# Patient Record
Sex: Male | Born: 1965 | Race: White | Hispanic: No | Marital: Married | Smoking: Never smoker
Health system: Southern US, Community
[De-identification: ages and names within clinical notes are randomized; demographics above are authoritative.]

## PROBLEM LIST (undated history)

## (undated) DIAGNOSIS — N289 Disorder of kidney and ureter, unspecified: Secondary | ICD-10-CM

## (undated) HISTORY — PX: CHOLECYSTECTOMY: SHX55

---

## 2012-10-11 ENCOUNTER — Ambulatory Visit: Payer: Self-pay

## 2012-10-23 ENCOUNTER — Ambulatory Visit: Payer: Self-pay | Admitting: Surgery

## 2014-06-28 IMAGING — XA DG CHOLANGIOGRAM OPERATIVE
5 series · 11 of 11 positions shown · non-contrast
Comparison: none

REASON FOR EXAM: Cholelithiasis
COMMENTS:

[Series 1: cont. · 1 of 1 slices shown (1 of 4)]
[im 1/1]
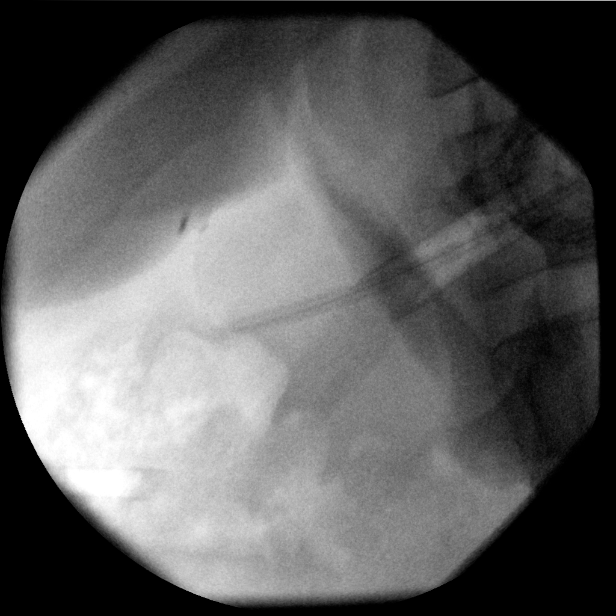

[Series 2: cont. · 3 of 3 frames shown (2 of 4)]
[frame 1/3]
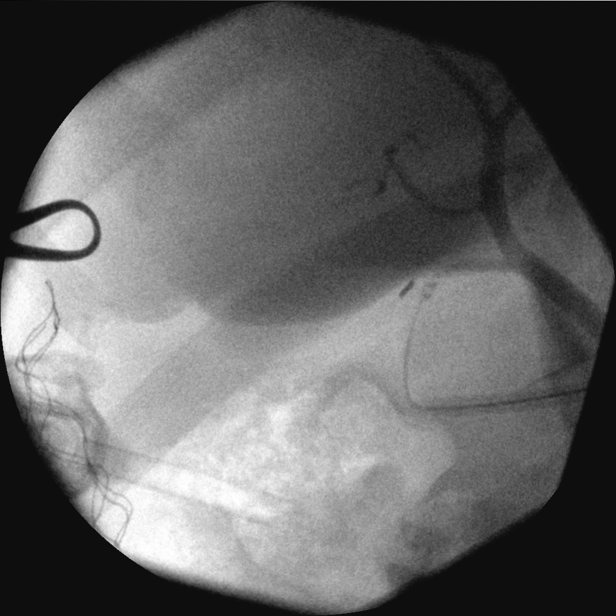
[frame 2/3]
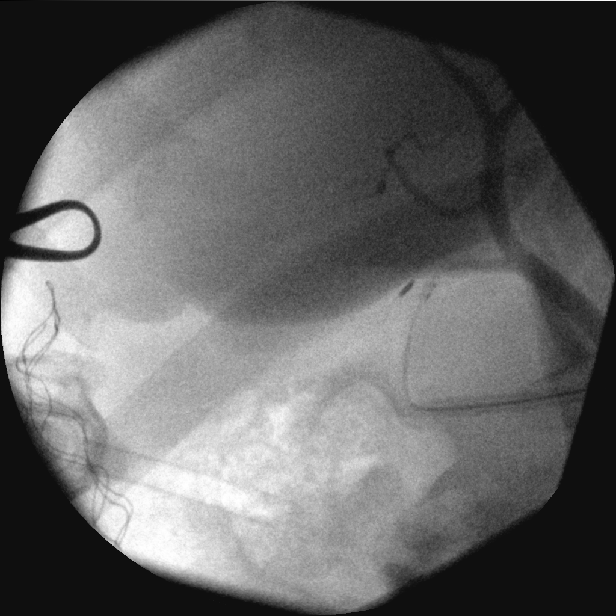
[frame 3/3]
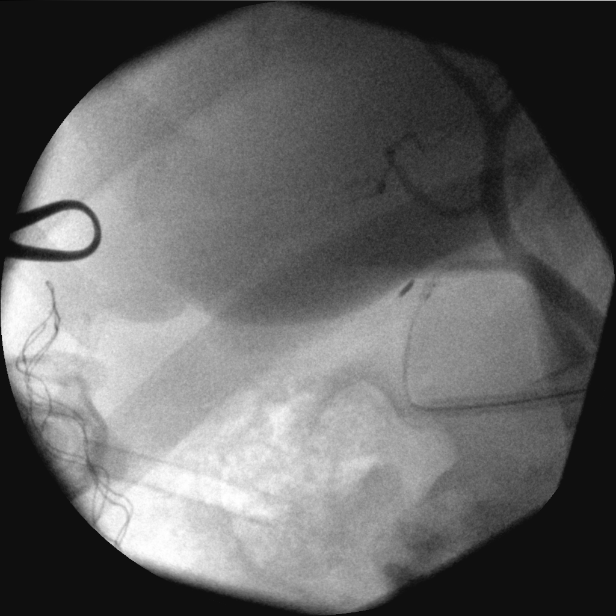

[Series 3: cont. · 2 of 2 frames shown (3 of 4)]
[frame 1/2]
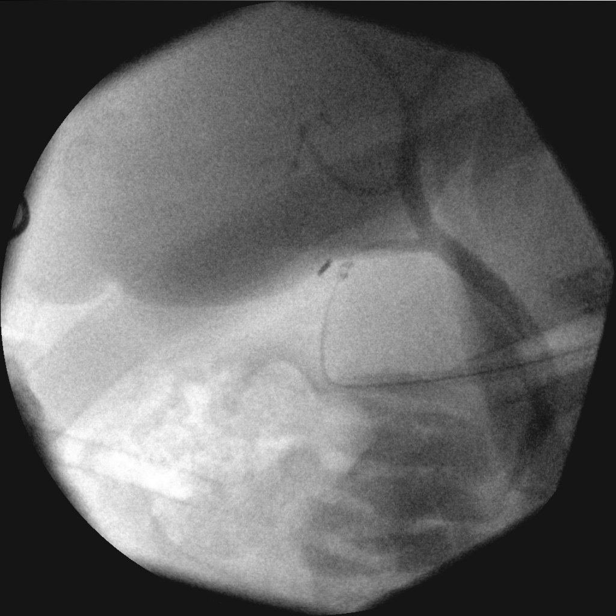
[frame 2/2]
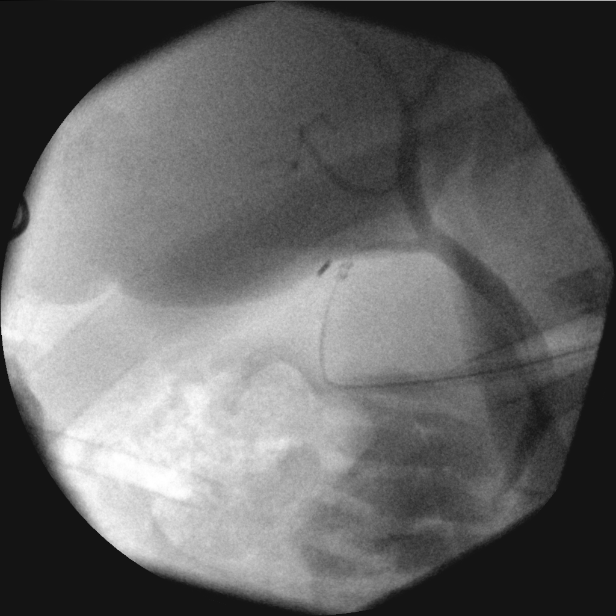

[Series 4: cont. · 4 of 9 frames shown (4 of 4)]
[frame 2/9]
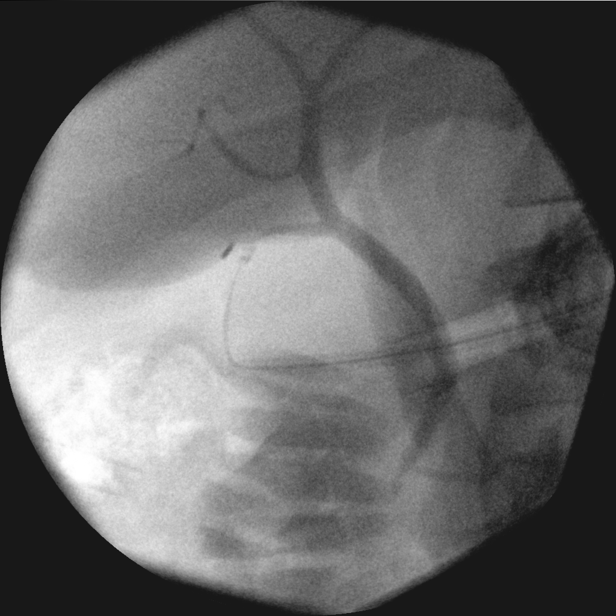
[frame 3/9]
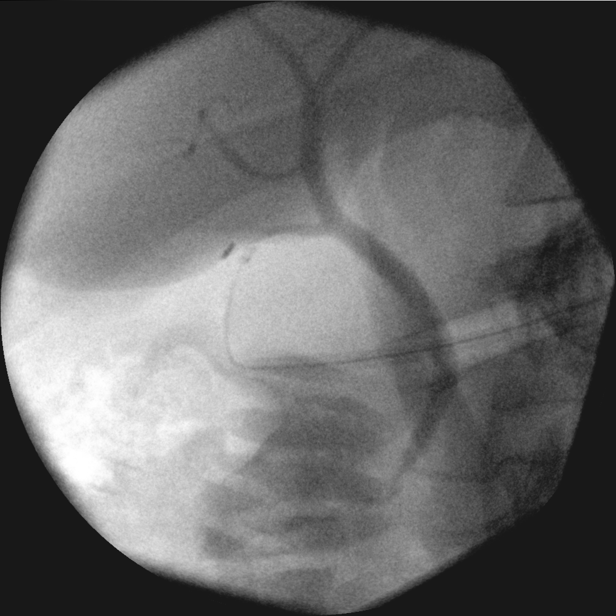
[frame 5/9]
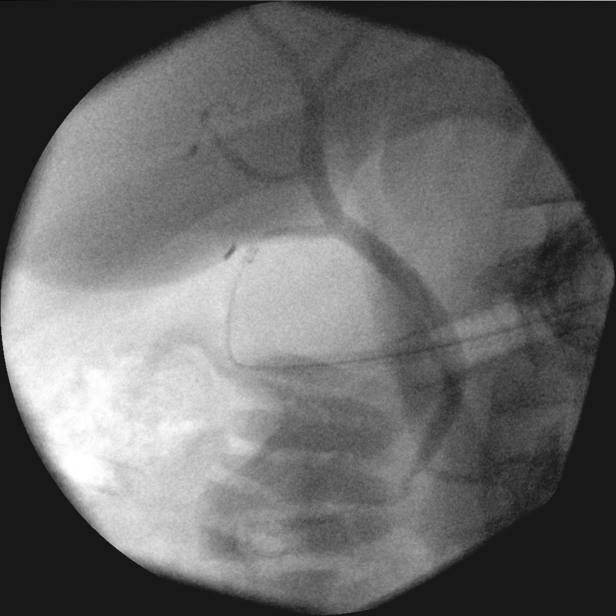
[frame 8/9]
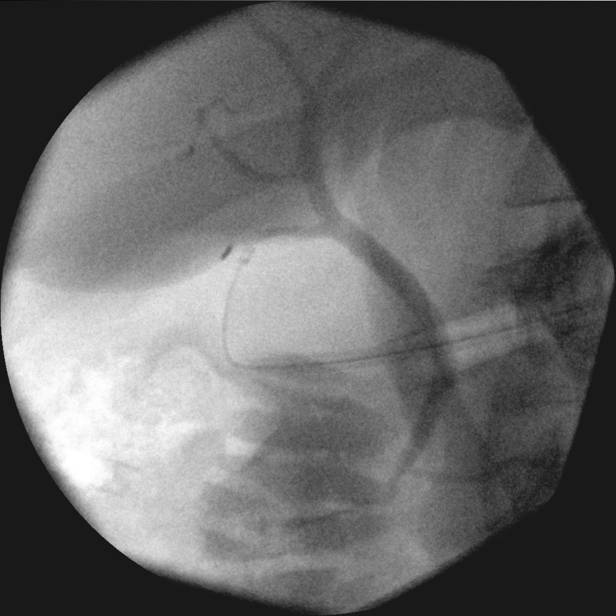

[Series 6001: (id) · 1 of 1 slices shown]
[im 1/1]
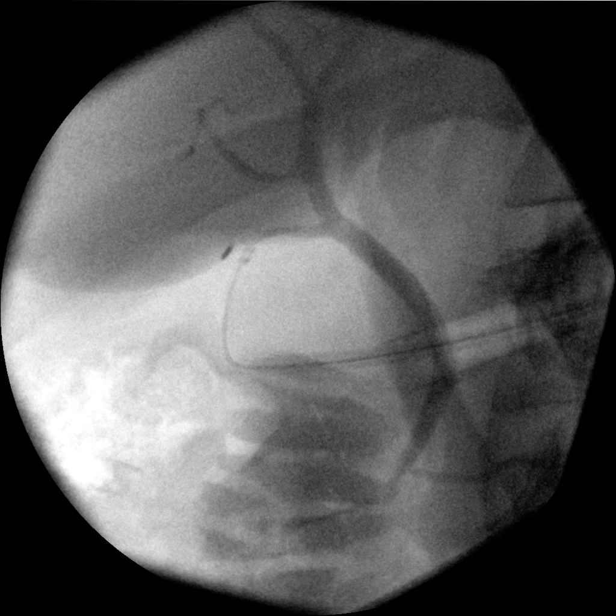

[11 of 11 positions shown; findings below may reference images not displayed]

PROCEDURE:     DXR - DXR CHOLANGIOGRAM OP (INITIAL)  - October 23, 2012  [DATE]

RESULT:     Intraoperative digital C-arm images from cholangiographic
procedure demonstrate opacification of the cystic duct, common bile duct,
common hepatic duct and left and right hepatic ducts without filling defect
or evidence of obstruction. Contrast flows into the small bowel.
IMPRESSION: No evidence of obstruction or retained biliary duct stones.

[REDACTED]

## 2014-07-05 NOTE — Op Note (Signed)
PATIENT NAME:  Cody Jackson, Cody Jackson MR#:  [id number] DATE OF BIRTH:  05/13/1965  DATE OF PROCEDURE:  10/23/2012  PREOPERATIVE DIAGNOSIS: Acute cholecystitis, cholelithiasis.   POSTOPERATIVE DIAGNOSIS: Acute cholecystitis, cholelithiasis.   PROCEDURE: Laparoscopic cholecystectomy, cholangiogram.   SURGEON: Renda RollsWilton Smith, MD  ANESTHESIA: General.   INDICATION: This 49 year old male had recent epigastric pain and ultrasound findings of gallstones with thickened gallbladder wall, and surgery was recommended for definitive treatment.   DESCRIPTION OF PROCEDURE: The patient was placed on the operating table in the supine position under general anesthesia. The abdomen was clipped and prepared with ChloraPrep and draped in a sterile manner.   A short incision was made in the inferior aspect of the umbilicus and carried down to the deep fascia which was grasped with laryngeal hook and elevated. A Veress needle was inserted, aspirated and irrigated with a saline solution. Next, the peritoneal cavity was inflated with carbon dioxide. The Veress needle was removed. The 10 mm cannula was inserted. The 10 mm, 0 degree laparoscope was inserted to view the peritoneal cavity. The liver appeared normal. Brief survey in all directions revealed no gross abnormality. Another incision was made in the epigastrium slightly to the right of the midline to introduce an 11 mm cannula. Two incisions were made in the lateral aspect of the right upper quadrant to introduce two 5 mm cannulas.   The gallbladder was found to be inflamed with thickened wall. It was retracted towards the right shoulder. The infundibulum was retracted inferiorly and laterally. The porta hepatis was noted. The neck of the gallbladder was mobilized with incision of the visceral peritoneum. The cystic duct was dissected free from surrounding structures. The cystic artery was identified and dissected free from surrounding structures. There was some moderate  degree of oozing and several small bleeding points were cauterized, and with further dissection a critical view of safety was demonstrated. An Endo Clip was placed across the cystic duct adjacent to the neck of the gallbladder. An incision was made in the cystic duct to introduce a Reddick catheter. Half-strength Conray-60 dye was injected as the cholangiogram was done with fluoroscopy viewing the biliary tree and prompt flow of dye into the duodenum. The pancreatic duct was also identified. There were no stones seen in the bile duct, and the Reddick catheter was removed. The cystic duct was doubly ligated with endoclips and divided. The cystic artery was controlled with double endoclips and divided. The gallbladder was dissected free from the liver with hook and cautery. There were several small bleeding points which were cauterized, and the gallbladder was completely separated. There was 1 bleeding point at the fundal end of the gallbladder, in the gallbladder bed, which was cauterized several times and subsequently resolved. The site was irrigated with heparinized saline solution and aspirated. Hemostasis was intact. It is noted that during the course of dissection there was 1 small pocket of pus which was encountered which may have amounted to less than a cubic centimeter, but did not run down, was quickly suctioned. The gallbladder was placed into an Endo Catch bag and pulled up through the infraumbilical incision. The gallbladder was incised and was aspirated. There was thickening of the gallbladder wall and large stones. The incision was lengthened by about 8 mm, and the fascial incision was lengthened by about 8 mm, and with further dissection and manipulation and suctioning, the gallbladder was removed, still within the bag, and submitted in formalin for routine pathology. The right upper quadrant was further inspected, irrigated  and aspirated. Hemostasis was intact. The cannulas were removed. Carbon  dioxide was allowed to escape from the peritoneal cavity. There was some bleeding from the upper most 5 mm incision, which was suture ligated with 5-0 chromic Next, the fascia was repaired at the umbilicus with a 0 Maxon figure-of-eight suture obliterating the opening in the fascia. Next, all incisions were closed with interrupted 5-0 chromic subcuticular sutures, benzoin and Steri-Strips. Dressings were applied with paper tape. The patient tolerated surgery satisfactorily and was then prepared for transfer to the recovery room. ____________________________ Shela Commons. Renda Rolls, MD [email or web address] D: 10/23/2012 09:27:45 ET T: 10/23/2012 10:47:36 ET JOB#: [id number]  cc: Adella Hare, MD, <Dictator> Adella Hare MD ELECTRONICALLY SIGNED 10/24/2012 15:58

## 2016-06-11 ENCOUNTER — Ambulatory Visit
Admission: EM | Admit: 2016-06-11 | Discharge: 2016-06-11 | Disposition: A | Discharge status: Home / Self Care | Payer: 59 | Attending: Family Medicine | Admitting: Family Medicine

## 2016-06-11 ENCOUNTER — Encounter: Payer: Self-pay | Admitting: *Deleted

## 2016-06-11 DIAGNOSIS — R109 Unspecified abdominal pain: Secondary | ICD-10-CM | POA: Diagnosis not present

## 2016-06-11 HISTORY — DX: Disorder of kidney and ureter, unspecified: N28.9

## 2016-06-11 LAB — URINALYSIS, COMPLETE (UACMP) WITH MICROSCOPIC
GLUCOSE, UA: NEGATIVE mg/dL
Leukocytes, UA: NEGATIVE
NITRITE: NEGATIVE
Protein, ur: 30 mg/dL — AB
SQUAMOUS EPITHELIAL / LPF: NONE SEEN
pH: 5.5 (ref 5.0–8.0)

## 2016-06-11 LAB — BASIC METABOLIC PANEL
Anion gap: 7 (ref 5–15)
BUN: 13 mg/dL (ref 6–20)
CALCIUM: 9.3 mg/dL (ref 8.9–10.3)
CHLORIDE: 104 mmol/L (ref 101–111)
CO2: 25 mmol/L (ref 22–32)
Creatinine, Ser: 1.28 mg/dL — ABNORMAL HIGH (ref 0.61–1.24)
GFR calc non Af Amer: >60 mL/min (ref >60)
GLUCOSE: 110 mg/dL — AB (ref 65–99)
Potassium: 3.8 mmol/L (ref 3.5–5.1)
Sodium: 136 mmol/L (ref 135–145)

## 2016-06-11 LAB — CBC WITH DIFFERENTIAL/PLATELET
BASOS PCT: 1 %
Basophils Absolute: 0.1 10*3/uL (ref 0–0.1)
Eosinophils Absolute: 0.2 10*3/uL (ref 0–0.7)
Eosinophils Relative: 2 %
HCT: 43.6 % (ref 40.0–52.0)
Hemoglobin: 14.9 g/dL (ref 13.0–18.0)
LYMPHS ABS: 1.8 10*3/uL (ref 1.0–3.6)
Lymphocytes Relative: 20 %
MCH: 31.4 pg (ref 26.0–34.0)
MCHC: 34.3 g/dL (ref 32.0–36.0)
MCV: 91.7 fL (ref 80.0–100.0)
MONO ABS: 0.5 10*3/uL (ref 0.2–1.0)
Monocytes Relative: 6 %
Neutro Abs: 6.6 10*3/uL — ABNORMAL HIGH (ref 1.4–6.5)
Neutrophils Relative %: 71 %
Platelets: 297 10*3/uL (ref 150–440)
RBC: 4.76 MIL/uL (ref 4.40–5.90)
RDW: 13.4 % (ref 11.5–14.5)
WBC: 9.2 10*3/uL (ref 3.8–10.6)

## 2016-06-11 MED ORDER — TAMSULOSIN HCL 0.4 MG PO CAPS: 0.4000 mg | ORAL_CAPSULE | Freq: Every day | ORAL | 0 refills | Status: AC

## 2016-06-11 MED ORDER — OXYCODONE-ACETAMINOPHEN 5-325 MG PO TABS: 1.0000 | ORAL_TABLET | Freq: Three times a day (TID) | ORAL | 0 refills | Status: AC | PRN

## 2016-06-11 NOTE — Discharge Instructions (Signed)
Take medication as prescribed. Rest. Drink plenty of fluids.   Follow up with urology or primary care physician as discussed. Return to Urgent care or Emergency room for fever, increased pain, new or worsening concerns.

## 2016-06-11 NOTE — ED Triage Notes (Signed)
Patient started having lower left back/flank pain today. Patient is also experiencing urinary urgency. History of kidney stones.

## 2016-06-11 NOTE — ED Provider Notes (Addendum)
[facility] ____________________________________________  Time seen: Approximately 12:00 PM  I have reviewed the triage vital signs and the nursing notes.   HISTORY  Chief Complaint Flank Pain and Urinary Frequency   HPI Cody Jackson is a 51 y.o. male presents with a complaint of left flank pain. Patient reports left flank pain since approximately 9 AM this morning. Denies trigger or injury. Patient reports pain is constant at a 4 or 5 out of 10 with occasional increases of pain. Patient does also reports that he feels like he needs to urinate more frequently, denies flow changes or burning with urination. Patient reports that he did have mild left flank pain last week that resolved after drinking a lot of water. Patient reports he has a history of similar presentation with a left-sided kidney stone. Patient reports last kidney stones approximate 6 years ago and he was able to pass, denies any previous surgery for kidney stones. Denies previous obstructing stones. Patient states left flank pain is occasionally worse with movement but not consistently. Denies any definitive aggravating or alleviating factors.  Patient denies any penile or testicular pain, swelling, rash, penile discharge, perineal or rectal discomfort. Patient denies any recent sickness. Has continued to eat and drink well. Denies fevers.Denies chest pain, shortness of breath, abdominal pain, dysuria, extremity pain, extremity swelling or rash. Denies recent sickness. Denies recent antibiotic use.     Past Medical History:  Diagnosis Date  . Renal disorder     There are no active problems to display for this patient.   Past Surgical History:  Procedure Laterality Date  . CHOLECYSTECTOMY       No current facility-administered medications for this encounter.   Current Outpatient Prescriptions:  .  oxyCODONE-acetaminophen (ROXICET) 5-325 MG tablet, Take 1 tablet by mouth every 8 (eight) hours as needed  for moderate pain or severe pain (Do not drive or operate heavy machinery while taking as can cause drowsiness.)., Disp: 9 tablet, Rfl: 0 .  tamsulosin (FLOMAX) 0.4 MG CAPS capsule, Take 1 capsule (0.4 mg total) by mouth daily., Disp: 10 capsule, Rfl: 0  Allergies Patient has no known allergies.  family history Father: kidney stones  Social History Social History  Substance Use Topics  . Smoking status: Never Smoker  . Smokeless tobacco: Never Used  . Alcohol use Yes    Review of Systems Constitutional: No fever/chills Eyes: No visual changes. ENT: No sore throat. Cardiovascular: Denies chest pain. Respiratory: Denies shortness of breath. Gastrointestinal: No abdominal pain.  No nausea, no vomiting. No diarrhea. No constipation.  Genitourinary: As above.  Musculoskeletal: As above. Skin: Negative for rash.  ____________________________________________   PHYSICAL EXAM:  VITAL SIGNS: ED Triage Vitals  Enc Vitals Group     BP 06/11/16 1052 (!) 165/107     Pulse Rate 06/11/16 1052 75     Resp 06/11/16 1052 16     Temp 06/11/16 1052 97.5 F (36.4 C)     Temp Source 06/11/16 1052 Oral     SpO2 06/11/16 1052 99 %     Weight 06/11/16 1053 255 lb (115.7 kg)     Height 06/11/16 1053 6' (1.829 m)     Head Circumference --      Peak Flow --      Pain Score 06/11/16 1054 6     Pain Loc --      Pain Edu? --      Excl. in GC? --     Constitutional: Alert and oriented. Well  appearing and in no acute distress. Eyes: Conjunctivae are normal. PERRL. EOMI. ENT      Head: Normocephalic and atraumatic. Cardiovascular: Normal rate, regular rhythm. Grossly normal heart sounds.  Good peripheral circulation. Respiratory: Normal respiratory effort without tachypnea nor retractions. Breath sounds are clear and equal bilaterally. No wheezes, rales, rhonchi. Gastrointestinal: Soft and nontender. No distention. Normal Bowel sounds. Minimal left CVA tenderness. No right CVA  tenderness. Musculoskeletal:  Ambulatory with steady gait. No midline cervical, thoracic or lumbar tenderness to palpation. .  Neurologic:  Normal speech and language. Speech is normal. No gait instability.  Skin:  Skin is warm, dry and intact. No rash noted. Psychiatric: Mood and affect are normal. Speech and behavior are normal. Patient exhibits appropriate insight and judgment   ___________________________________________   LABS (all labs ordered are listed, but only abnormal results are displayed)  Labs Reviewed  URINALYSIS, COMPLETE (UACMP) WITH MICROSCOPIC - Abnormal; Notable for the following:       Result Value   Specific Gravity, Urine >1.030 (*)    Hgb urine dipstick MODERATE (*)    Bilirubin Urine SMALL (*)    Ketones, ur TRACE (*)    Protein, ur 30 (*)    Bacteria, UA RARE (*)    All other components within normal limits  CBC WITH DIFFERENTIAL/PLATELET - Abnormal; Notable for the following:    Neutro Abs 6.6 (*)    All other components within normal limits  BASIC METABOLIC PANEL - Abnormal; Notable for the following:    Glucose, Bld 110 (*)    Creatinine, Ser 1.28 (*)    All other components within normal limits  URINE CULTURE   ____________________________________________ RADIOLOGY  No results found. ____________________________________________   PROCEDURES Procedures   INITIAL IMPRESSION / ASSESSMENT AND PLAN / ED COURSE  Pertinent labs & imaging results that were available during my care of the patient were reviewed by me and considered in my medical decision making (see chart for details).  Very well-appearing patient. No acute distress. Presents for the complaints of left flank pain. Discussed in detail with patient suspect kidney stone, also discussed evaluation of urinary tract infection and prostatitis. Urinalysis reviewed, moderate hemoglobin, too numerous to count RBCs, trace ketones, small bilirubin and protein, with rare bacteria. Will culture  urine. CBC and BMP also reviewed. Discussed these results in detail with patient including creatinine of 1.28. Discussed in detail with patient suspect ureterolithiasis and discuss and imaging with patient. Discussed with patient, use of imaging and no CT or ultrasound available at this facility at this time. Patient declines KUB of BASIC x-ray at this time. Will treat patient supportively, and discussed very strict return and follow-up parameters with patient. Discussed for any increased pain, difficulty urinating, fevers or worsening concerns procedure with emergency room for further evaluation and likely CT imaging to evaluate stone placement versus obstruction. Information for local urology given to follow-up with this coming week as patient does not currently have a primary care physician. Will treat patient with Flomax and when necessary Percocet. Urine strainer given. Encouraged fluids and supportive care.Discussed indication, risks and benefits of medications with patient.   Discussed follow up with Primary care physician this week. Discussed follow up and return parameters including no resolution or any worsening concerns. Patient verbalized understanding and agreed to plan.   [place] controlled substance database reviewed, no controlled substances documented in last 1 year.  ____________________________________________   FINAL CLINICAL IMPRESSION(S) / ED DIAGNOSES  Final diagnoses:  Left flank pain  Discharge Medication List as of 06/11/2016 12:16 PM    START taking these medications   Details  oxyCODONE-acetaminophen (ROXICET) 5-325 MG tablet Take 1 tablet by mouth every 8 (eight) hours as needed for moderate pain or severe pain (Do not drive or operate heavy machinery while taking as can cause drowsiness.)., Starting Fri 06/11/2016, Print    tamsulosin (FLOMAX) 0.4 MG CAPS capsule Take 1 capsule (0.4 mg total) by mouth daily., Starting Fri 06/11/2016, Normal        Note:  This dictation was prepared with Dragon dictation along with smaller phrase technology. Any transcriptional errors that result from this process are unintentional.        Renford Dills, NP 06/11/16 1510

## 2016-06-13 LAB — URINE CULTURE: Culture: <10000 — AB

## 2019-06-02 ENCOUNTER — Ambulatory Visit: Payer: Self-pay | Attending: Internal Medicine

## 2019-06-02 DIAGNOSIS — Z23 Encounter for immunization: Secondary | ICD-10-CM

## 2019-06-02 NOTE — Progress Notes (Signed)
   Covid-19 Vaccination Clinic  Name:  Cody Jackson    MRN: [id number] DOB: 07-09-1965  06/02/2019  Mr. [name] was observed post Covid-19 immunization for 15 minutes without incident. He was provided with Vaccine Information Sheet and instruction to access the V-Safe system.   Mr. [name] was instructed to call 911 with any severe reactions post vaccine: Marland Kitchen Difficulty breathing  . Swelling of face and throat  . A fast heartbeat  . A bad rash all over body  . Dizziness and weakness   Immunizations Administered    Name Date Dose VIS Date Route   Moderna COVID-19 Vaccine 06/02/2019 10:48 AM 0.5 mL 02/13/2019 Intramuscular   Manufacturer: Moderna   Lot: [id number]   NDC: 13143-888-75

## 2019-07-04 ENCOUNTER — Ambulatory Visit: Payer: Self-pay | Attending: Internal Medicine

## 2019-07-04 DIAGNOSIS — Z23 Encounter for immunization: Secondary | ICD-10-CM

## 2019-07-04 NOTE — Progress Notes (Signed)
   Covid-19 Vaccination Clinic  Name:  Cody Jackson    MRN: [id number] DOB: January 19, 1966  07/04/2019  Mr. [name] was observed post Covid-19 immunization for 15 minutes without incident. He was provided with Vaccine Information Sheet and instruction to access the V-Safe system.   Mr. [name] was instructed to call 911 with any severe reactions post vaccine: Marland Kitchen Difficulty breathing  . Swelling of face and throat  . A fast heartbeat  . A bad rash all over body  . Dizziness and weakness   Immunizations Administered    Name Date Dose VIS Date Route   Moderna COVID-19 Vaccine 07/04/2019  2:47 PM 0.5 mL 02/2019 Intramuscular   Manufacturer: Moderna   Lot: [id number]   NDC: 63846-659-93
